# Patient Record
Sex: Male | Born: 1980 | Hispanic: Yes | Marital: Single | Smoking: Never smoker
Health system: Southern US, Community
[De-identification: ages and names within clinical notes are randomized; demographics above are authoritative.]

## PROBLEM LIST (undated history)

## (undated) DIAGNOSIS — Z789 Other specified health status: Secondary | ICD-10-CM

## (undated) DIAGNOSIS — M109 Gout, unspecified: Secondary | ICD-10-CM

## (undated) HISTORY — DX: Other specified health status: Z78.9

## (undated) HISTORY — PX: NO PAST SURGERIES: SHX2092

---

## 2016-06-09 ENCOUNTER — Emergency Department (HOSPITAL_COMMUNITY)
Admission: EM | Admit: 2016-06-09 | Discharge: 2016-06-10 | Disposition: A | Discharge status: Home / Self Care | Payer: Self-pay | Attending: Emergency Medicine | Admitting: Emergency Medicine

## 2016-06-09 ENCOUNTER — Emergency Department (HOSPITAL_COMMUNITY): Payer: Self-pay

## 2016-06-09 ENCOUNTER — Encounter (HOSPITAL_COMMUNITY): Payer: Self-pay | Admitting: Emergency Medicine

## 2016-06-09 DIAGNOSIS — W1839XA Other fall on same level, initial encounter: Secondary | ICD-10-CM | POA: Insufficient documentation

## 2016-06-09 DIAGNOSIS — Y929 Unspecified place or not applicable: Secondary | ICD-10-CM | POA: Insufficient documentation

## 2016-06-09 DIAGNOSIS — Y939 Activity, unspecified: Secondary | ICD-10-CM | POA: Insufficient documentation

## 2016-06-09 DIAGNOSIS — S2242XA Multiple fractures of ribs, left side, initial encounter for closed fracture: Secondary | ICD-10-CM | POA: Insufficient documentation

## 2016-06-09 DIAGNOSIS — Y999 Unspecified external cause status: Secondary | ICD-10-CM | POA: Insufficient documentation

## 2016-06-09 LAB — CBC WITH DIFFERENTIAL/PLATELET
Basophils Absolute: 0.1 10*3/uL (ref 0.0–0.1)
Basophils Relative: 1 %
Eosinophils Absolute: 0.4 10*3/uL (ref 0.0–0.7)
Eosinophils Relative: 5 %
HCT: 44.4 % (ref 39.0–52.0)
Hemoglobin: 15.6 g/dL (ref 13.0–17.0)
Lymphocytes Relative: 18 %
Lymphs Abs: 1.4 10*3/uL (ref 0.7–4.0)
MCH: 31.2 pg (ref 26.0–34.0)
MCHC: 35.1 g/dL (ref 30.0–36.0)
MCV: 88.8 fL (ref 78.0–100.0)
Monocytes Absolute: 0.5 10*3/uL (ref 0.1–1.0)
Monocytes Relative: 7 %
Neutro Abs: 5.4 10*3/uL (ref 1.7–7.7)
Neutrophils Relative %: 69 %
Platelets: 256 10*3/uL (ref 150–400)
RBC: 5 MIL/uL (ref 4.22–5.81)
RDW: 11.9 % (ref 11.5–15.5)
WBC: 7.7 10*3/uL (ref 4.0–10.5)

## 2016-06-09 LAB — URINALYSIS, ROUTINE W REFLEX MICROSCOPIC
Bilirubin Urine: NEGATIVE
Glucose, UA: NEGATIVE mg/dL
Hgb urine dipstick: NEGATIVE
Ketones, ur: NEGATIVE mg/dL
Leukocytes, UA: NEGATIVE
Nitrite: NEGATIVE
Protein, ur: NEGATIVE mg/dL
Specific Gravity, Urine: 1.004 — ABNORMAL LOW (ref 1.005–1.030)
pH: 5.5 (ref 5.0–8.0)

## 2016-06-09 LAB — BASIC METABOLIC PANEL
Anion gap: 10 (ref 5–15)
BUN: 5 mg/dL — ABNORMAL LOW (ref 6–20)
CO2: 23 mmol/L (ref 22–32)
Calcium: 8.8 mg/dL — ABNORMAL LOW (ref 8.9–10.3)
Chloride: 100 mmol/L — ABNORMAL LOW (ref 101–111)
Creatinine, Ser: 0.57 mg/dL — ABNORMAL LOW (ref 0.61–1.24)
GFR calc Af Amer: >60 mL/min (ref >60)
GFR calc non Af Amer: >60 mL/min (ref >60)
Glucose, Bld: 120 mg/dL — ABNORMAL HIGH (ref 65–99)
Potassium: 3.6 mmol/L (ref 3.5–5.1)
Sodium: 133 mmol/L — ABNORMAL LOW (ref 135–145)

## 2016-06-09 MED ORDER — CYCLOBENZAPRINE HCL 10 MG PO TABS: 10.0000 mg | ORAL_TABLET | Freq: Two times a day (BID) | ORAL | 0 refills | Status: DC | PRN

## 2016-06-09 MED ORDER — DEXAMETHASONE SODIUM PHOSPHATE 10 MG/ML IJ SOLN
10.0000 mg | Freq: Once | INTRAMUSCULAR | Status: AC
  Administered 2016-06-10: 10 mg via INTRAVENOUS
  Filled 2016-06-09: qty 1

## 2016-06-09 MED ORDER — SODIUM CHLORIDE 0.9 % IV BOLUS (SEPSIS)
500.0000 mL | Freq: Once | INTRAVENOUS | Status: AC
  Administered 2016-06-09: 500 mL via INTRAVENOUS

## 2016-06-09 MED ORDER — HYDROCODONE-ACETAMINOPHEN 5-325 MG PO TABS: 1.0000 | ORAL_TABLET | Freq: Three times a day (TID) | ORAL | 0 refills | Status: DC | PRN

## 2016-06-09 MED ORDER — DIAZEPAM 5 MG/ML IJ SOLN
2.5000 mg | Freq: Once | INTRAMUSCULAR | Status: AC
  Administered 2016-06-09: 2.5 mg via INTRAVENOUS
  Filled 2016-06-09: qty 2

## 2016-06-09 MED ORDER — FENTANYL CITRATE (PF) 100 MCG/2ML IJ SOLN
100.0000 ug | Freq: Once | INTRAMUSCULAR | Status: AC
  Administered 2016-06-09: 100 ug via INTRAVENOUS
  Filled 2016-06-09: qty 2

## 2016-06-09 MED ORDER — IBUPROFEN 800 MG PO TABS: 800.0000 mg | ORAL_TABLET | Freq: Three times a day (TID) | ORAL | 0 refills | Status: DC

## 2016-06-09 MED ORDER — KETOROLAC TROMETHAMINE 30 MG/ML IJ SOLN
30.0000 mg | Freq: Once | INTRAMUSCULAR | Status: AC
  Administered 2016-06-10: 30 mg via INTRAVENOUS
  Filled 2016-06-09: qty 1

## 2016-06-09 NOTE — ED Provider Notes (Signed)
[facility]-EMERGENCY DEPT Provider Note   CSN: [id number] Arrival date & time: 06/09/16  2039     History   Chief Complaint Chief Complaint  Patient presents with  . Back Pain    HPI Evan Green is a 35 y.o. male.  The history is provided by the patient and a relative. The history is limited by a language barrier. A language interpreter was used.  Back Pain   This is a new problem. The current episode started 3 to 5 hours ago. The problem occurs constantly. The problem has not changed since onset.The pain is associated with falling. The pain is present in the thoracic spine. Associated symptoms include tingling. Pertinent negatives include no chest pain, no fever, no numbness, no abdominal pain, no abdominal swelling, no bowel incontinence, no perianal numbness, no bladder incontinence, no dysuria, no pelvic pain, no leg pain and no weakness. He has tried nothing for the symptoms.     Pt is 35 y/o male, otherwise healthy, presents to the ER with sudden onset left back, rib and side pain that began 3 hours ago secondary to mechanical fall backwards from standing position landed with his left back and side on the seat of a chair.  His pain is severe, 10/10, constant, pt is unable to describe anyway other than "very painful" and "deep inside back", worse with movement, palpation and breathing, no alleviating factors.  The pain radiates down his left leg and is worse with leg movement, has some associated leg tingling, but denies numbness or weakness..  No treatments attempted prior to arrival.  He denies CP, abdominal pain, hematuria, Cough, hemoptysis, shortness of breath. He denies head injury, LOC, or any other associated sx.      History reviewed. No pertinent past medical history.  There are no active problems to display for this patient.   History reviewed. No pertinent surgical history.     Home Medications    Prior to Admission medications   Medication Sig Start Date  End Date Taking? Authorizing Provider  cyclobenzaprine (FLEXERIL) 10 MG tablet Take 1 tablet (10 mg total) by mouth 2 (two) times daily as needed for muscle spasms. 06/09/16   Danelle Berry, PA-C  HYDROcodone-acetaminophen (NORCO/VICODIN) 5-325 MG tablet Take 1-2 tablets by mouth every 8 (eight) hours as needed. 06/09/16   Danelle Berry, PA-C  ibuprofen (ADVIL,MOTRIN) 800 MG tablet Take 1 tablet (800 mg total) by mouth 3 (three) times daily. 06/09/16   Danelle Berry, PA-C    Family History No family history on file.  Social History Social History  Substance Use Topics  . Smoking status: Never Smoker  . Smokeless tobacco: Never Used  . Alcohol use Yes     Allergies   Review of patient's allergies indicates no known allergies.   Review of Systems Review of Systems  Constitutional: Negative for fever.  Cardiovascular: Negative for chest pain.  Gastrointestinal: Negative for abdominal pain and bowel incontinence.  Genitourinary: Negative for bladder incontinence, dysuria and pelvic pain.  Musculoskeletal: Positive for back pain.  Neurological: Positive for tingling. Negative for weakness and numbness.  All other systems reviewed and are negative.    Physical Exam Updated Vital Signs BP 104/76 (BP Location: Left Arm)   Pulse 73   Temp 97.4 F (36.3 C) (Oral)   Resp 17   Ht 5\' 4"  (1.626 m)   Wt 69.1 kg   SpO2 99%   BMI 26.14 kg/m   Physical Exam  Constitutional: He is oriented to person, place,  and time. He appears well-developed and well-nourished. No distress.  HENT:  Head: Normocephalic and atraumatic.  Right Ear: External ear normal.  Left Ear: External ear normal.  Nose: Nose normal.  Mouth/Throat: Oropharynx is clear and moist. No oropharyngeal exudate.  Eyes: Conjunctivae and EOM are normal. Pupils are equal, round, and reactive to light. Right eye exhibits no discharge. Left eye exhibits no discharge. No scleral icterus.  Neck: Normal range of motion. Neck supple.    Cardiovascular: Normal rate, regular rhythm, normal heart sounds and intact distal pulses.  Exam reveals no gallop and no friction rub.   No murmur heard.   Pulmonary/Chest: Effort normal and breath sounds normal. No stridor. No respiratory distress. He has no wheezes. He has no rales.     He exhibits tenderness.  Left lateral ribs ttp  Abdominal: Soft. Normal appearance and bowel sounds are normal. He exhibits no distension and no mass. There is no tenderness. There is CVA tenderness. There is no rigidity, no rebound, no guarding and negative Murphy's sign.    Musculoskeletal: Normal range of motion. He exhibits tenderness. He exhibits no edema or deformity.       Cervical back: Normal.       Thoracic back: He exhibits tenderness. He exhibits normal range of motion, no bony tenderness, no swelling, no edema, no deformity, no laceration, no pain and no spasm.       Lumbar back: He exhibits tenderness. He exhibits normal range of motion, no bony tenderness, no swelling, no edema, no deformity, no laceration, no pain, no spasm and normal pulse.       Back:  Neurological: He is alert and oriented to person, place, and time. He exhibits normal muscle tone. Coordination normal.  Normal sensation to light touch in all extremities 5/5 dorsiflexion and plantarflexion bilaterally Antalgic gait  Skin: Skin is warm and dry. No abrasion, no bruising and no rash noted. He is not diaphoretic. No erythema. No pallor.  No erythema, abrasions, edema, bruising to chest, back or abdomen  Psychiatric: He has a normal mood and affect. His behavior is normal. Judgment and thought content normal.  Nursing note and vitals reviewed.    ED Treatments / Results  Labs (all labs ordered are listed, but only abnormal results are displayed) Labs Reviewed  URINALYSIS, ROUTINE W REFLEX MICROSCOPIC (NOT AT [facility]) - Abnormal; Notable for the following:       Result Value   Specific Gravity, Urine 1.004 (*)    All  other components within normal limits  BASIC METABOLIC PANEL - Abnormal; Notable for the following:    Sodium 133 (*)    Chloride 100 (*)    Glucose, Bld 120 (*)    BUN 5 (*)    Creatinine, Ser 0.57 (*)    Calcium 8.8 (*)    All other components within normal limits  CBC WITH DIFFERENTIAL/PLATELET    EKG  EKG Interpretation None       Radiology Dg Ribs Unilateral W/chest Left  Result Date: 06/09/2016 CLINICAL DATA:  Status post fall.  Left chest wall pain. EXAM: LEFT RIBS AND CHEST - 3+ VIEW COMPARISON:  None. FINDINGS: There is a nondisplaced fracture of the midshaft of the left eleventh rib. Possible nondisplaced fracture of the distal aspect of the left Health for a is also noted. Cardiomediastinal contours are normal. Lungs are clear. IMPRESSION: Suspected nondisplaced fractures of the mid to distal left eleventh and twelfth ribs. Electronically Signed   By: Deatra Robinson  M.D.   On: 06/09/2016 22:49    Procedures Procedures (including critical care time)  Medications Ordered in ED Medications  diazepam (VALIUM) injection 2.5 mg (2.5 mg Intravenous Given 06/09/16 2207)  fentaNYL (SUBLIMAZE) injection 100 mcg (100 mcg Intravenous Given 06/09/16 2206)  sodium chloride 0.9 % bolus 500 mL (0 mLs Intravenous Stopped 06/10/16 0003)  dexamethasone (DECADRON) injection 10 mg (10 mg Intravenous Given 06/10/16 0000)  ketorolac (TORADOL) 30 MG/ML injection 30 mg (30 mg Intravenous Given 06/10/16 0000)     Initial Impression / Assessment and Plan / ED Course  I have reviewed the triage vital signs and the nursing notes.  Pertinent labs & imaging results that were available during my care of the patient were reviewed by me and considered in my medical decision making (see chart for details).  Clinical Course  Patient with mechanical fall into a chair, striking his left side and back. He has localized tenderness to palpation in his left lower lateral ribs. Normal breath sounds.   Basic lab work obtained at triage, prior to my evaluation. Left rib films pertinent for suspected nondisplaced fractures of the mid to distal left 11th and 12th ribs. This doesn't clinically correlate with his tenderness. He has no respiratory distress, no flail chest, is maintaining normal oxygenation, and has no work of breathing. We'll give pain medication, muscle relaxers, and incentive spirometry.  Reviewed with patient the purpose and procedure for using incentive spirometry and was encouraged to use it every hour.  He was given a work note to give him light duty for much of the next week. He was encouraged to return to the ER if he is having shortness of breath, productive sputum, fever. He does not have a PCP, and return precautions were reviewed with him at length, attempted to prevent discharge instructions in Spanish however it did not work Engineer, structuralpanish translator by phone and Spanish-speaking staff that was used to review instructions, all questions asked and answered, patient verbalizes understanding of plan. He is discharged home in good condition with stable vital signs.  Final Clinical Impressions(s) / ED Diagnoses   Final diagnoses:  Closed fracture of multiple ribs of left side, initial encounter    New Prescriptions Discharge Medication List as of 06/09/2016 11:54 PM    START taking these medications   Details  cyclobenzaprine (FLEXERIL) 10 MG tablet Take 1 tablet (10 mg total) by mouth 2 (two) times daily as needed for muscle spasms., Starting Thu 06/09/2016, Print    HYDROcodone-acetaminophen (NORCO/VICODIN) 5-325 MG tablet Take 1-2 tablets by mouth every 8 (eight) hours as needed., Starting Thu 06/09/2016, Print    ibuprofen (ADVIL,MOTRIN) 800 MG tablet Take 1 tablet (800 mg total) by mouth 3 (three) times daily., Starting Thu 06/09/2016, Print         Danelle BerryLeisa Virl Coble, PA-C 06/10/16 [id number]    Raeford RazorStephen Kohut, MD 06/12/16 [phone]

## 2016-06-09 NOTE — ED Notes (Signed)
Patient transported to X-ray 

## 2016-06-09 NOTE — ED Notes (Signed)
Urine specimen sent with pt to room.

## 2016-06-09 NOTE — ED Triage Notes (Signed)
Pt. reports left lower back/flank pain onset this evening , denies injury , no hematuria or dysuria , pain increases with movement / changing positions .

## 2016-06-09 NOTE — ED Notes (Signed)
Pt. returned from XR. 

## 2017-06-25 ENCOUNTER — Emergency Department (HOSPITAL_COMMUNITY)
Admission: EM | Admit: 2017-06-25 | Discharge: 2017-06-25 | Disposition: A | Discharge status: Home / Self Care | Payer: Self-pay | Attending: Emergency Medicine | Admitting: Emergency Medicine

## 2017-06-25 ENCOUNTER — Encounter (HOSPITAL_COMMUNITY): Payer: Self-pay

## 2017-06-25 ENCOUNTER — Other Ambulatory Visit: Payer: Self-pay

## 2017-06-25 DIAGNOSIS — M25561 Pain in right knee: Secondary | ICD-10-CM | POA: Insufficient documentation

## 2017-06-25 DIAGNOSIS — I776 Arteritis, unspecified: Secondary | ICD-10-CM | POA: Insufficient documentation

## 2017-06-25 DIAGNOSIS — D692 Other nonthrombocytopenic purpura: Secondary | ICD-10-CM | POA: Insufficient documentation

## 2017-06-25 DIAGNOSIS — M25562 Pain in left knee: Secondary | ICD-10-CM | POA: Insufficient documentation

## 2017-06-25 LAB — URINALYSIS, ROUTINE W REFLEX MICROSCOPIC
BILIRUBIN URINE: NEGATIVE
Glucose, UA: NEGATIVE mg/dL
KETONES UR: NEGATIVE mg/dL
LEUKOCYTES UA: NEGATIVE
Nitrite: NEGATIVE
PH: 5 (ref 5.0–8.0)
PROTEIN: NEGATIVE mg/dL
SQUAMOUS EPITHELIAL / LPF: NONE SEEN
Specific Gravity, Urine: 1.004 — ABNORMAL LOW (ref 1.005–1.030)

## 2017-06-25 LAB — COMPREHENSIVE METABOLIC PANEL
ALBUMIN: 4.1 g/dL (ref 3.5–5.0)
ALT: 115 U/L — AB (ref 17–63)
AST: 135 U/L — ABNORMAL HIGH (ref 15–41)
Alkaline Phosphatase: 125 U/L (ref 38–126)
Anion gap: 12 (ref 5–15)
BUN: 11 mg/dL (ref 6–20)
CHLORIDE: 101 mmol/L (ref 101–111)
CO2: 27 mmol/L (ref 22–32)
Calcium: 9.5 mg/dL (ref 8.9–10.3)
Creatinine, Ser: 0.73 mg/dL (ref 0.61–1.24)
GFR calc non Af Amer: >60 mL/min (ref >60)
Glucose, Bld: 91 mg/dL (ref 65–99)
Potassium: 3.9 mmol/L (ref 3.5–5.1)
SODIUM: 140 mmol/L (ref 135–145)
TOTAL PROTEIN: 8.1 g/dL (ref 6.5–8.1)
Total Bilirubin: 0.8 mg/dL (ref 0.3–1.2)

## 2017-06-25 LAB — CBC WITH DIFFERENTIAL/PLATELET
BASOS ABS: 0 10*3/uL (ref 0.0–0.1)
Basophils Relative: 0 %
Eosinophils Absolute: 0.1 10*3/uL (ref 0.0–0.7)
Eosinophils Relative: 1 %
HEMATOCRIT: 47.2 % (ref 39.0–52.0)
HEMOGLOBIN: 16.7 g/dL (ref 13.0–17.0)
Lymphocytes Relative: 12 %
Lymphs Abs: 1.4 10*3/uL (ref 0.7–4.0)
MCH: 31.7 pg (ref 26.0–34.0)
MCHC: 35.4 g/dL (ref 30.0–36.0)
MCV: 89.6 fL (ref 78.0–100.0)
Monocytes Absolute: 0.8 10*3/uL (ref 0.1–1.0)
Monocytes Relative: 7 %
NEUTROS ABS: 8.9 10*3/uL — AB (ref 1.7–7.7)
NEUTROS PCT: 80 %
PLATELETS: 317 10*3/uL (ref 150–400)
RBC: 5.27 MIL/uL (ref 4.22–5.81)
RDW: 12.2 % (ref 11.5–15.5)
WBC: 11.2 10*3/uL — AB (ref 4.0–10.5)

## 2017-06-25 LAB — SEDIMENTATION RATE: SED RATE: 5 mm/h (ref 0–16)

## 2017-06-25 LAB — MONONUCLEOSIS SCREEN: Mono Screen: NEGATIVE

## 2017-06-25 NOTE — Discharge Instructions (Signed)
IT IS VERY IMPORTANT FOR YOU TO CALL THE CLINIC TO GET AN APPOINTMENT THIS WEEK. COME BACK TO THE ER IF YOU HAVE A FEVER, ABDOMINAL PAIN, BLOOD IN YOUR URINE, OR IF THE RASH GETS A LOT WORSE.

## 2017-06-25 NOTE — ED Provider Notes (Signed)
[place] DEPT Provider Note   CSN: [id number] Arrival date & time: 06/25/17  1747     History   Chief Complaint Chief Complaint  Patient presents with  . Knee Pain  . Rash    HPI Evan Green is a 36 y.o. male.  36 year old male who presents with rash and knee pain.  Today he began having bilateral knee pain that is worse with walking as well as some pain in his dorsal right foot.  He noticed a few red spots on his abdomen yesterday but looked down today and noticed that the rash had spread to his bilateral thighs.  He denies any significant itching, but he has mild pain associated with it.  No abdominal pain, vomiting, diarrhea, fevers, contacts with similar rash, or recent travel.  No new medications or supplements.  He is not sexually active.  No chest pain or breathing problems.  No urinary symptoms. He drinks ~6 beers/day.   The history is provided by the patient.    History reviewed. No pertinent past medical history.  There are no active problems to display for this patient.   History reviewed. No pertinent surgical history.     Home Medications    Prior to Admission medications   Medication Sig Start Date End Date Taking? Authorizing Provider  cyclobenzaprine (FLEXERIL) 10 MG tablet Take 1 tablet (10 mg total) by mouth 2 (two) times daily as needed for muscle spasms. 06/09/16   Delsa Grana, PA-C  HYDROcodone-acetaminophen (NORCO/VICODIN) 5-325 MG tablet Take 1-2 tablets by mouth every 8 (eight) hours as needed. 06/09/16   Delsa Grana, PA-C  ibuprofen (ADVIL,MOTRIN) 800 MG tablet Take 1 tablet (800 mg total) by mouth 3 (three) times daily. 06/09/16   Delsa Grana, PA-C    Family History No family history on file.  Social History Social History   Tobacco Use  . Smoking status: Never Smoker  . Smokeless tobacco: Never Used  Substance Use Topics  . Alcohol use: Yes  . Drug use: No     Allergies   Patient has  no known allergies.   Review of Systems Review of Systems All other systems reviewed and are negative except that which was mentioned in HPI   Physical Exam Updated Vital Signs BP (!) 135/106 (BP Location: Left Arm)   Pulse 86   Temp 98.7 F (37.1 C) (Oral)   Resp 16   Ht _0  (1.626 m)   Wt 67.1 kg (148 lb)   SpO2 99%   BMI 25.40 kg/m   Physical Exam  Constitutional: He is oriented to person, place, and time. He appears well-developed and well-nourished. No distress.  HENT:  Head: Normocephalic and atraumatic.  Moist mucous membranes  Eyes: Conjunctivae are normal. Pupils are equal, round, and reactive to light.  Neck: Neck supple.  Cardiovascular: Normal rate, regular rhythm and normal heart sounds.  No murmur heard. Pulmonary/Chest: Effort normal and breath sounds normal.  Abdominal: Soft. Bowel sounds are normal. He exhibits no distension. There is no tenderness.  Musculoskeletal: He exhibits tenderness.  Mild b/l knee tenderness diffusely without obvious joint effusion; mild swelling of dorsal R foot without associated redness  Neurological: He is alert and oriented to person, place, and time.  Fluent speech  Skin: Skin is warm and dry. Rash noted.  See photo  Psychiatric: He has a normal mood and affect. Judgment normal.  Nursing note and vitals reviewed.        ED Treatments / Results  Labs (all labs ordered are listed, but only abnormal results are displayed) Labs Reviewed  COMPREHENSIVE METABOLIC PANEL - Abnormal; Notable for the following components:      Result Value   AST 135 (*)    ALT 115 (*)    All other components within normal limits  CBC WITH DIFFERENTIAL/PLATELET - Abnormal; Notable for the following components:   WBC 11.2 (*)    Neutro Abs 8.9 (*)    All other components within normal limits  URINALYSIS, ROUTINE W REFLEX MICROSCOPIC - Abnormal; Notable for the following components:   Color, Urine STRAW (*)    Specific Gravity, Urine  1.004 (*)    Hgb urine dipstick SMALL (*)    Bacteria, UA RARE (*)    All other components within normal limits  MONONUCLEOSIS SCREEN  SEDIMENTATION RATE  C-REACTIVE PROTEIN    EKG  EKG Interpretation None       Radiology No results found.  Procedures Procedures (including critical care time)  Medications Ordered in ED Medications - No data to display   Initial Impression / Assessment and Plan / ED Course  I have reviewed the triage vital signs and the nursing notes.  Pertinent labs that were available during my care of the patient were reviewed by me and considered in my medical decision making (see chart for details).     PT w/ 1 day of b/l knee pain now with worsening rash on legs. VS reassuring, afebrile. Purpura on exam. Appearance suggests vasculitis, HSP would be very unusual for his age. Concurrent joint pains further suggest autoimmune process. He denies sexual activity, although I considered disseminated gonococcal infection I feel this is extremely unlikely.  His labs show normal hemoglobin and platelets, normal creatinine thus I doubt TTP or ITP. UA also reassuring. WBC 11.2 and patient has denied any other infectious symptoms, although viral infection is possible I feel this is less likely.  His LFTs are mildly elevated at AST 135, ALT 115.  Can see this elevation with an acute viral infection but he also admits to moderate amount of drinking daily.  I have instructed him to discontinue all alcohol use to see if his LFTs improve spontaneously.  Mononucleosis negative and ESR normal.  I have explained that it will be very important for him to follow closely in a clinic to have further testing, including possible referral to rheumatology or dermatology for biopsy.  I have given him internal medicine teaching clinic contact information and I have also left a voicemail with case manager to follow-up with him.  His correct phone number has been updated in chart.  Extensively  reviewed return precautions with him including fever, worsening rash, hematuria, abdominal pain, or any other sudden change in symptoms.  He voiced understanding and was discharged in satisfactory condition.  Final Clinical Impressions(s) / ED Diagnoses   Final diagnoses:  Purpura ([place])  Arthralgia of both knees  Vasculitis [facility])    ED Discharge Orders    None       Levada Bowersox, Wenda Overland, MD 06/25/17 2354

## 2017-06-25 NOTE — ED Triage Notes (Signed)
Pt  Is alert and oriented x 4 and is friendly. presents with c/o bilateral knee pain with joint swelling that worsens with ambulation . Pt has palpable red rash on bilateral upper legs pt denies itching, pt reports symptoms started today.

## 2017-06-26 LAB — C-REACTIVE PROTEIN

## 2017-07-03 ENCOUNTER — Ambulatory Visit: Payer: Self-pay | Admitting: Internal Medicine

## 2017-07-03 VITALS — BP 123/80 | HR 56 | Temp 97.8°F | Wt 157.8 lb

## 2017-07-03 DIAGNOSIS — Z7251 High risk heterosexual behavior: Secondary | ICD-10-CM

## 2017-07-03 DIAGNOSIS — D692 Other nonthrombocytopenic purpura: Secondary | ICD-10-CM

## 2017-07-03 NOTE — Patient Instructions (Signed)
Mr. Evan Green,  I am going to check some lab work on you today. I will let you know the results in the next couple of days. If the labs are normal I do not think we need to pursue any more testing as your symptoms are improving. Please follow up with us if the rash or symptoms return and we can look more into things. Otherwise, please come back and see us in 1 year.

## 2017-07-03 NOTE — Assessment & Plan Note (Signed)
Patient with resolving palpable purpura over the medial aspects of his bilateral thighs with associated bilateral knee pain. Labs done in the ED were unremarkable. ESR and CRP both normal. Would expect some degree of elevation with a vasculitic process. Symptoms appear to be improving and resolving on their own over the past week.   Will check for HIV, Hep C and B. Given his high risk sexual history will also check for G/Ch and syphilis as well.   If labs return normal do not believe further work up is necessary at this time unless symptoms reoccur.

## 2017-07-03 NOTE — Progress Notes (Signed)
CC: Rash/Arthralgias; to establish care  HPI:  [name] is a 36 y.o. male with no past medical history presents to clinic today following 06/25/17 ED visit at [facility] for rash and knee pain to establish care in our clinic.   He initially presented to the ED last week with complaints of bilateral knee pain and rash on his abdomen and bilateral thighs. Found to have purpura on exam. Labs were only remarkable for mildly elevated AST/ALT (135/115) and mild leukocytosis (11.2) but otherwise CMET, CBC, ESR, CRP were all unremarkable. UA was obtained with small blood seen but no RBCs.   Today, he reports that his bilateral knees continue to bother him. Symptoms began 8 days prior suddenly while at work. He denies any falls or trauma or other precipitating event. Reports a sharp pain in both of his knees. The pain is precipitated by rising from a seated position or with exertion. She has been taking naproxen with does improve his symptoms. He reports he has had similar pain in his right knee 1 year prior which improved following knee injection and taking 'some pills' but he is unsure what medication he was prescribed. Does report associated swelling and warmth in both his knees though neither is present today. States that the rash appeared suddenly at the same time as the knee pain and has never been present before. This was painful at the time. Reports that the rash has almost completely resolved. He denies being sick prior to the episode. No know family history of any vasculitis, autoimmune or other diseases. He denies any fevers, chills, night sweats, fatigue, myalgias, dark urine or hematuria, abdominal pain or melena, chest pain, dyspnea, peripheral neuropathy. Does not 2 week history of non-productive cough.   Reports history of 6 sexual partners in the past year with intermittent condom use. Denies any dysuria, penile lesions or discharge.   PMH None  PSH No prior  surgeries  FH Patient is not aware of any family medical hisotry  Social History   Socioeconomic History  . Marital status: Single    Spouse name: None  . Number of children: None  . Years of education: None  . Highest education level: None  Social Needs  . Financial resource strain: None  . Food insecurity - worry: None  . Food insecurity - inability: None  . Transportation needs - medical: None  . Transportation needs - non-medical: None  Occupational History  . Occupation: Cook  Tobacco Use  . Smoking status: Never Smoker  . Smokeless tobacco: Never Used  Substance and Sexual Activity  . Alcohol use: Yes    Comment: 10 beers a day; quit 06/25/17  . Drug use: No  . Sexual activity: Yes    Partners: Female    Comment: 6 different partners in the past year. Uses condoms occasionally.   Other Topics Concern  . None  Social History Narrative   Lives alone. In [place].    Review of Systems:  Negative except as noted in HPI  Physical Exam:  Vitals:   07/03/17 0939  BP: 123/80  Pulse: (!) 56  Temp: 97.8 F (36.6 C)  TempSrc: Oral  SpO2: 100%  Weight: 157 lb 12.8 oz (71.6 kg)   Physical Exam  Constitutional: He is oriented to person, place, and time and well-developed, well-nourished, and in no distress. No distress.  HENT:  Head: Normocephalic and atraumatic.  Right Ear: External ear normal.  Left Ear: External ear normal.  Nose: Nose normal.  Mouth/Throat: Oropharynx is clear and moist. No oropharyngeal exudate.  Eyes: EOM are normal. Pupils are equal, round, and reactive to light.  Neck: Normal range of motion.  Cardiovascular: Normal rate, regular rhythm and normal heart sounds.  Pulmonary/Chest: Effort normal and breath sounds normal. He has no wheezes. He has no rales.  Abdominal: Soft. Bowel sounds are normal. He exhibits no distension and no mass. There is no tenderness.  Musculoskeletal: He exhibits no edema.       Right knee: Normal.       Left  knee: Normal.  Lymphadenopathy:    He has no cervical adenopathy.  Neurological: He is alert and oriented to person, place, and time.  Skin: Skin is warm and dry.  Circular areas of mild hyperpigmentation over bilateral inner thighs. No erythema or warmth.  Psychiatric: Mood and affect normal.    Assessment & Plan:   See Encounters Tab for problem based charting.  Patient discussed with Evan Green

## 2017-07-03 NOTE — Progress Notes (Signed)
Case discussed with Dr. Karma GreaserBoswell at the time of the visit. We reviewed the resident's history and exam and pertinent patient test results. I agree with the assessment, diagnosis, and plan of care documented in the resident's note.  The cause of the rash is unclear, but given the risky behavior and transaminitis I agree with the initial work-up Dr. Karma GreaserBoswell has proposed.  With the risky behavior and arthralgias we will keep Behcet's in the back of our minds, especially if there is recurrence or oral ulcers.

## 2017-07-03 NOTE — Assessment & Plan Note (Signed)
6 male partners in the past year with intermittent condom use.   Checking HIV, RPR, G/Ch today.

## 2017-07-04 LAB — HEPATITIS B CORE ANTIBODY, TOTAL: Hep B Core Total Ab: NEGATIVE

## 2017-07-04 LAB — HEPATITIS B SURFACE ANTIBODY,QUALITATIVE: Hep B Surface Ab, Qual: NONREACTIVE

## 2017-07-04 LAB — RPR: RPR: NONREACTIVE

## 2017-07-04 LAB — URINE CYTOLOGY ANCILLARY ONLY
Chlamydia: NEGATIVE
NEISSERIA GONORRHEA: NEGATIVE

## 2017-07-04 LAB — HEPATITIS C ANTIBODY

## 2017-07-04 LAB — HEPATITIS B SURFACE ANTIGEN: HEP B S AG: NEGATIVE

## 2017-07-04 LAB — HIV ANTIBODY (ROUTINE TESTING W REFLEX): HIV SCREEN 4TH GENERATION: NONREACTIVE

## 2017-11-09 ENCOUNTER — Other Ambulatory Visit: Payer: Self-pay

## 2017-11-09 ENCOUNTER — Emergency Department (HOSPITAL_COMMUNITY): Payer: Self-pay

## 2017-11-09 ENCOUNTER — Encounter (HOSPITAL_COMMUNITY): Payer: Self-pay | Admitting: Emergency Medicine

## 2017-11-09 ENCOUNTER — Emergency Department (HOSPITAL_COMMUNITY)
Admission: EM | Admit: 2017-11-09 | Discharge: 2017-11-09 | Disposition: A | Discharge status: Home / Self Care | Payer: Self-pay | Attending: Emergency Medicine | Admitting: Emergency Medicine

## 2017-11-09 DIAGNOSIS — M654 Radial styloid tenosynovitis [de Quervain]: Secondary | ICD-10-CM | POA: Insufficient documentation

## 2017-11-09 DIAGNOSIS — Z79899 Other long term (current) drug therapy: Secondary | ICD-10-CM | POA: Insufficient documentation

## 2017-11-09 MED ORDER — NAPROXEN 250 MG PO TABS: 500.0000 mg | ORAL_TABLET | Freq: Once | ORAL | Status: DC

## 2017-11-09 MED ORDER — IBUPROFEN 400 MG PO TABS
400.0000 mg | ORAL_TABLET | Freq: Once | ORAL | Status: AC | PRN
  Administered 2017-11-09: 400 mg via ORAL
  Filled 2017-11-09: qty 1

## 2017-11-09 MED ORDER — NAPROXEN 500 MG PO TABS: 500.0000 mg | ORAL_TABLET | Freq: Two times a day (BID) | ORAL | 0 refills | Status: DC

## 2017-11-09 MED ORDER — IBUPROFEN 400 MG PO TABS
400.0000 mg | ORAL_TABLET | Freq: Once | ORAL | Status: AC
  Administered 2017-11-09: 400 mg via ORAL
  Filled 2017-11-09: qty 1

## 2017-11-09 NOTE — ED Triage Notes (Signed)
Reports waking up Wednesday with swelling and pain in left hand/wrist.  Has not taken anything for pain.  Noted to be red and slightly swollen.  Denies any injury.

## 2017-11-09 NOTE — Discharge Instructions (Signed)
Please read and follow all provided instructions.  Your diagnoses today include:  1. De Quervain's disease (tenosynovitis)    Tests performed today include:  An x-ray of the affected area - does NOT show any broken bones  Vital signs. See below for your results today.   Medications prescribed:   Naproxen - anti-inflammatory pain medication  Do not exceed 500mg  naproxen every 12 hours, take with food  You have been prescribed an anti-inflammatory medication or NSAID. Take with food. Take smallest effective dose for the shortest duration needed for your pain. Stop taking if you experience stomach pain or vomiting.   Take any prescribed medications only as directed.  Home care instructions:   Follow any educational materials contained in this packet  Follow R.I.C.E. Protocol:  R - rest your injury   I  - use ice on injury without applying directly to skin  C - compress injury with bandage or splint  E - elevate the injury as much as possible  Follow-up instructions: Please follow-up with your primary care provider if you continue to have significant pain in 1 week.  Return instructions:   Please return if your fingers are numb or tingling, appear gray or blue, or you have severe pain (also elevate the arm and loosen splint or wrap if you were given one)  Please return to the Emergency Department if you experience worsening symptoms.   Please return if you have any other emergent concerns.  Additional Information:  Your vital signs today were: BP 114/71 (BP Location: Right Arm)    Pulse 79    Temp 98.9 F (37.2 C) (Oral)    Resp 16    SpO2 100%  If your blood pressure (BP) was elevated above 135/85 this visit, please have this repeated by your doctor within one month. --------------

## 2017-11-09 NOTE — ED Provider Notes (Signed)
[facility] Provider Note   CSN: [id number] Arrival date & time: 11/09/17  0025     History   Chief Complaint Chief Complaint  Patient presents with  . Hand Pain    HPI [facility] Didion is a 37 y.o. male.  Patient reports waking up this morning with acute pain in his left radial wrist.  He denies acute injury.  Pain is worse with movement and palpation.  No treatments prior to arrival.  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  No weakness, numbness, or tingling in the fingers.  No elbow or shoulder pain.  No neck pain.     Past Medical History:  Diagnosis Date  . Known health problems: none     Patient Active Problem List   Diagnosis Date Noted  . Palpable purpura (HCC) 07/03/2017  . High risk sexual behavior 07/03/2017    Past Surgical History:  Procedure Laterality Date  . NO PAST SURGERIES          Home Medications    Prior to Admission medications   Medication Sig Start Date End Date Taking? Authorizing Provider  cyclobenzaprine (FLEXERIL) 10 MG tablet Take 1 tablet (10 mg total) by mouth 2 (two) times daily as needed for muscle spasms. 06/09/16   Danelle Berryapia, Leisa, PA-C  HYDROcodone-acetaminophen (NORCO/VICODIN) 5-325 MG tablet Take 1-2 tablets by mouth every 8 (eight) hours as needed. 06/09/16   Danelle Berryapia, Leisa, PA-C  ibuprofen (ADVIL,MOTRIN) 800 MG tablet Take 1 tablet (800 mg total) by mouth 3 (three) times daily. 06/09/16   Danelle Berryapia, Leisa, PA-C  naproxen (NAPROSYN) 500 MG tablet Take 1 tablet (500 mg total) by mouth 2 (two) times daily. 11/09/17   Renne CriglerGeiple, Regena Delucchi, PA-C    Family History Family History  Family history unknown: Yes    Social History Social History   Tobacco Use  . Smoking status: Never Smoker  . Smokeless tobacco: Never Used  Substance Use Topics  . Alcohol use: Yes    Comment: 10 beers a day; quit 06/25/17  . Drug use: No     Allergies   Patient  has no known allergies.   Review of Systems Review of Systems  Constitutional: Negative for activity change.  Musculoskeletal: Positive for arthralgias. Negative for back pain, gait problem, joint swelling and neck pain.  Skin: Negative for wound.  Neurological: Negative for weakness and numbness.     Physical Exam Updated Vital Signs BP 114/71 (BP Location: Right Arm)   Pulse 79   Temp 98.9 F (37.2 C) (Oral)   Resp 16   SpO2 100%   Physical Exam  Constitutional: He appears well-developed and well-nourished.  HENT:  Head: Normocephalic and atraumatic.  Eyes: Conjunctivae are normal.  Neck: Normal range of motion. Neck supple.  Cardiovascular: Normal pulses. Exam reveals no decreased pulses.  Musculoskeletal: He exhibits tenderness. He exhibits no edema.       Left shoulder: Normal.       Left elbow: Normal.       Left wrist: He exhibits tenderness. He exhibits normal range of motion and no bony tenderness.       Arms:      Left hand: Normal.  Neurological: He is alert. No sensory deficit.  Motor, sensation, and vascular distal to the injury is fully intact.   Skin: Skin is warm and dry.  Psychiatric: He has a normal mood and affect.  Nursing note and vitals reviewed.  ED Treatments / Results  Labs (all labs ordered are listed, but only abnormal results are displayed) Labs Reviewed - No data to display  EKG None  Radiology Dg Hand Complete Left  Result Date: 11/09/2017 CLINICAL DATA:  37 year old male who awoke with left hand pain and swelling. No known injury. EXAM: LEFT HAND - COMPLETE 3+ VIEW COMPARISON:  None. FINDINGS: Bone mineralization is within normal limits. There is no evidence of fracture or dislocation. There is no evidence of arthropathy or other focal bone abnormality. Generalized soft tissue swelling. No soft tissue gas. No radiopaque foreign body identified. IMPRESSION: Soft tissue swelling with no other radiographic abnormality of the left  hand. Electronically Signed   By: Odessa Fleming M.D.   On: 11/09/2017 01:42    Procedures Procedures (including critical care time)  Medications Ordered in ED Medications  ibuprofen (ADVIL,MOTRIN) tablet 400 mg (400 mg Oral Given 11/09/17 0118)  ibuprofen (ADVIL,MOTRIN) tablet 400 mg (400 mg Oral Given 11/09/17 0544)     Initial Impression / Assessment and Plan / ED Course  I have reviewed the triage vital signs and the nursing notes.  Pertinent labs & imaging results that were available during my care of the patient were reviewed by me and considered in my medical decision making (see chart for details).     Patient seen and examined.  Interpreter used.  Patient updated on x-ray results.  Exam and history clinically consistent with radial styloid tenosynovitis.  Vital signs reviewed and are as follows: BP 114/71 (BP Location: Right Arm)   Pulse 79   Temp 98.9 F (37.2 C) (Oral)   Resp 16   SpO2 100%   Patient will be given Velcro splint to use for comfort, NSAID trial.  Encouraged follow-up with PCP if not improving.  Final Clinical Impressions(s) / ED Diagnoses   Final diagnoses:  De Quervain's disease (tenosynovitis)   Patient with radial head tenosynovitis of the left hand.  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  Imaging negative hand is neurovascularly intact.  ED Discharge Orders        Ordered    naproxen (NAPROSYN) 500 MG tablet  2 times daily     11/09/17 0626       Renne Crigler, PA-C 11/09/17 4098    Ward, Layla Maw, DO 11/09/17 [phone]

## 2019-05-09 ENCOUNTER — Emergency Department (HOSPITAL_COMMUNITY): Payer: Self-pay

## 2019-05-09 ENCOUNTER — Emergency Department (HOSPITAL_COMMUNITY)
Admission: EM | Admit: 2019-05-09 | Discharge: 2019-05-09 | Disposition: A | Discharge status: Home / Self Care | Payer: Self-pay | Attending: Emergency Medicine | Admitting: Emergency Medicine

## 2019-05-09 ENCOUNTER — Other Ambulatory Visit: Payer: Self-pay

## 2019-05-09 ENCOUNTER — Encounter (HOSPITAL_COMMUNITY): Payer: Self-pay | Admitting: Emergency Medicine

## 2019-05-09 DIAGNOSIS — Y929 Unspecified place or not applicable: Secondary | ICD-10-CM | POA: Insufficient documentation

## 2019-05-09 DIAGNOSIS — Y999 Unspecified external cause status: Secondary | ICD-10-CM | POA: Insufficient documentation

## 2019-05-09 DIAGNOSIS — S29011A Strain of muscle and tendon of front wall of thorax, initial encounter: Secondary | ICD-10-CM | POA: Insufficient documentation

## 2019-05-09 DIAGNOSIS — R0789 Other chest pain: Secondary | ICD-10-CM | POA: Insufficient documentation

## 2019-05-09 DIAGNOSIS — X58XXXA Exposure to other specified factors, initial encounter: Secondary | ICD-10-CM | POA: Insufficient documentation

## 2019-05-09 DIAGNOSIS — R079 Chest pain, unspecified: Secondary | ICD-10-CM

## 2019-05-09 DIAGNOSIS — Y939 Activity, unspecified: Secondary | ICD-10-CM | POA: Insufficient documentation

## 2019-05-09 LAB — BASIC METABOLIC PANEL
Anion gap: 12 (ref 5–15)
BUN: 7 mg/dL (ref 6–20)
CO2: 24 mmol/L (ref 22–32)
Calcium: 9 mg/dL (ref 8.9–10.3)
Chloride: 103 mmol/L (ref 98–111)
Creatinine, Ser: 0.66 mg/dL (ref 0.61–1.24)
GFR calc Af Amer: >60 mL/min (ref >60)
GFR calc non Af Amer: >60 mL/min (ref >60)
Glucose, Bld: 155 mg/dL — ABNORMAL HIGH (ref 70–99)
Potassium: 4 mmol/L (ref 3.5–5.1)
Sodium: 139 mmol/L (ref 135–145)

## 2019-05-09 LAB — CBC
HCT: 45.8 % (ref 39.0–52.0)
Hemoglobin: 15.2 g/dL (ref 13.0–17.0)
MCH: 30.4 pg (ref 26.0–34.0)
MCHC: 33.2 g/dL (ref 30.0–36.0)
MCV: 91.6 fL (ref 80.0–100.0)
Platelets: 222 10*3/uL (ref 150–400)
RBC: 5 MIL/uL (ref 4.22–5.81)
RDW: 12.4 % (ref 11.5–15.5)
WBC: 6.7 10*3/uL (ref 4.0–10.5)
nRBC: 0 % (ref 0.0–0.2)

## 2019-05-09 LAB — TROPONIN I (HIGH SENSITIVITY)
Troponin I (High Sensitivity): 6 ng/L (ref <18)
Troponin I (High Sensitivity): 5 ng/L (ref <18)

## 2019-05-09 MED ORDER — NAPROXEN 500 MG PO TABS: 500.0000 mg | ORAL_TABLET | Freq: Two times a day (BID) | ORAL | 0 refills | Status: DC | PRN

## 2019-05-09 MED ORDER — KETOROLAC TROMETHAMINE 60 MG/2ML IM SOLN
30.0000 mg | Freq: Once | INTRAMUSCULAR | Status: AC
  Administered 2019-05-09: 30 mg via INTRAMUSCULAR
  Filled 2019-05-09: qty 2

## 2019-05-09 MED ORDER — SODIUM CHLORIDE 0.9% FLUSH: 3.0000 mL | Freq: Once | INTRAVENOUS | Status: DC

## 2019-05-09 MED ORDER — DIAZEPAM 2 MG PO TABS
2.0000 mg | ORAL_TABLET | Freq: Once | ORAL | Status: AC
  Administered 2019-05-09: 2 mg via ORAL
  Filled 2019-05-09: qty 1

## 2019-05-09 MED ORDER — CYCLOBENZAPRINE HCL 10 MG PO TABS: 10.0000 mg | ORAL_TABLET | Freq: Two times a day (BID) | ORAL | 0 refills | Status: DC | PRN

## 2019-05-09 NOTE — Discharge Instructions (Signed)
Recommend taking new medication as prescribed for your chest pain.  Additionally recommend taking Tylenol.  Can also take the muscle relaxer as prescribed.  If you feel your symptoms worsen you develop shortness of breath or other new concerning symptom recommend return to ER for reassessment.  Otherwise recommend following up with primary doctor next week for recheck.

## 2019-05-09 NOTE — ED Triage Notes (Signed)
Pt states he started having CP yesterday and it has worsened today. Pain radiates to left shoulder. Pt went to primary MD and was sent here due to continuing cp. Denies N/V

## 2019-05-09 NOTE — ED Provider Notes (Signed)
[facility] EMERGENCY DEPARTMENT Provider Note   CSN: [id number] Arrival date & time: 05/09/19  1618     History   Chief Complaint Chief Complaint  Patient presents with  . Chest Pain    HPI [facility] Green is a 38 y.o. male.  Presents emerge department chief complaint chest pain.  Patient states pain started yesterday, worsening throughout the day today.  Up to 10/10 in severity, currently moderate.  States worsens with certain positions, worsens with deep breath, states his chest wall is very tender to palpation.  Works in a [employer].  No heavy lifting, no recent injuries.  No family history of coronary artery disease, no known medical problems, no smoking history.  Has not taken any medications for his pain.  No shortness of breath, syncope, fever.     HPI  Past Medical History:  Diagnosis Date  . Known health problems: none     Patient Active Problem List   Diagnosis Date Noted  . Palpable purpura (HCC) 07/03/2017  . High risk sexual behavior 07/03/2017    Past Surgical History:  Procedure Laterality Date  . NO PAST SURGERIES          Home Medications    Prior to Admission medications   Medication Sig Start Date End Date Taking? Authorizing Provider  cyclobenzaprine (FLEXERIL) 10 MG tablet Take 1 tablet (10 mg total) by mouth 2 (two) times daily as needed for muscle spasms. 06/09/16   Danelle Berry, PA-C  HYDROcodone-acetaminophen (NORCO/VICODIN) 5-325 MG tablet Take 1-2 tablets by mouth every 8 (eight) hours as needed. 06/09/16   Danelle Berry, PA-C  ibuprofen (ADVIL,MOTRIN) 800 MG tablet Take 1 tablet (800 mg total) by mouth 3 (three) times daily. 06/09/16   Danelle Berry, PA-C  naproxen (NAPROSYN) 500 MG tablet Take 1 tablet (500 mg total) by mouth 2 (two) times daily. 11/09/17   Renne Crigler, PA-C    Family History Family History  Family history unknown: Yes    Social History Social History   Tobacco Use  . Smoking  status: Never Smoker  . Smokeless tobacco: Never Used  Substance Use Topics  . Alcohol use: Yes    Comment: 10 beers a day; quit 06/25/17  . Drug use: No     Allergies   Patient has no known allergies.   Review of Systems Review of Systems  Constitutional: Negative for chills and fever.  HENT: Negative for ear pain and sore throat.   Eyes: Negative for pain and visual disturbance.  Respiratory: Negative for cough and shortness of breath.   Cardiovascular: Positive for chest pain. Negative for palpitations.  Gastrointestinal: Negative for abdominal pain and vomiting.  Genitourinary: Negative for dysuria and hematuria.  Musculoskeletal: Negative for arthralgias and back pain.  Skin: Negative for color change and rash.  Neurological: Negative for seizures and syncope.  All other systems reviewed and are negative.    Physical Exam Updated Vital Signs BP (!) 127/97 (BP Location: Right Arm)   Pulse 64   Temp 98.7 F (37.1 C) (Oral)   Resp (!) 25   Wt 56.7 kg   SpO2 100%   BMI 21.46 kg/m   Physical Exam Vitals signs and nursing note reviewed.  Constitutional:      Appearance: He is well-developed.  HENT:     Head: Normocephalic and atraumatic.  Eyes:     Conjunctiva/sclera: Conjunctivae normal.  Neck:     Musculoskeletal: Neck supple.  Cardiovascular:     Rate and  Rhythm: Normal rate and regular rhythm.     Heart sounds: No murmur.  Pulmonary:     Effort: Pulmonary effort is normal. No respiratory distress.     Breath sounds: Normal breath sounds.  Chest:     Comments: Focal tenderness palpation noted over left anterior, left lateral chest wall, no deformity, no erythema, no induration Abdominal:     Palpations: Abdomen is soft.     Tenderness: There is no abdominal tenderness.  Skin:    General: Skin is warm and dry.  Neurological:     Mental Status: He is alert.      ED Treatments / Results  Labs (all labs ordered are listed, but only abnormal  results are displayed) Labs Reviewed  BASIC METABOLIC PANEL - Abnormal; Notable for the following components:      Result Value   Glucose, Bld 155 (*)    All other components within normal limits  CBC  TROPONIN I (HIGH SENSITIVITY)  TROPONIN I (HIGH SENSITIVITY)    EKG EKG Interpretation  Date/Time:  Thursday May 09 2019 16:25:22 EDT Ventricular Rate:  89 PR Interval:  140 QRS Duration: 84 QT Interval:  346 QTC Calculation: 420 R Axis:   96 Text Interpretation:  Normal sinus rhythm Rightward axis No prior available for comparison No acute STEMI Confirmed by Madalyn Rob [phone]) on 05/09/2019 9:54:36 PM   Radiology Dg Chest 2 View  Result Date: 05/09/2019 CLINICAL DATA:  Onset chest pain yesterday which is worse today. Pain radiates to the left shoulder. EXAM: CHEST - 2 VIEW COMPARISON:  Single-view of the chest 06/09/2016. FINDINGS: Lungs are clear. Heart size is normal. No pneumothorax or pleural fluid. No acute or focal bony abnormality. IMPRESSION: Negative chest. Electronically Signed   By: Inge Rise M.D.   On: 05/09/2019 17:08    Procedures Procedures (including critical care time)  Medications Ordered in ED Medications  sodium chloride flush (NS) 0.9 % injection 3 mL (has no administration in time range)  ketorolac (TORADOL) injection 30 mg (30 mg Intramuscular Given 05/09/19 2242)  diazepam (VALIUM) tablet 2 mg (2 mg Oral Given 05/09/19 2241)     Initial Impression / Assessment and Plan / ED Course  I have reviewed the triage vital signs and the nursing notes.  Pertinent labs & imaging results that were available during my care of the patient were reviewed by me and considered in my medical decision making (see chart for details).        38 year old Hispanic gentleman presents to ER with new onset chest pain.  Patient noted to be well-appearing, significant tenderness palpation over chest wall.  EKG without acute ischemic changes, serial troponin  within normal limits, no delta troponin.  No family history and no known risk factors, low suspicion for acute coronary syndrome given this and above work-up.  Chest x-ray without acute abnormality.  Suspect most likely etiology MSK.  Given anti-inflammatories, recommend recheck with PCP, reviewed precautions, will discharge home.  Utilized Associate Professor throughout entirety of visit.  After the discussed management above, the patient was determined to be safe for discharge.  The patient was in agreement with this plan and all questions regarding their care were answered.  ED return precautions were discussed and the patient will return to the ED with any significant worsening of condition.    Final Clinical Impressions(s) / ED Diagnoses   Final diagnoses:  Chest pain, unspecified type  Muscle strain of chest wall, initial encounter    ED  Discharge Orders    None       Milagros Lollykstra, Nimo Verastegui S, MD 05/09/19 2322

## 2019-05-09 NOTE — ED Notes (Signed)
Spanish speaking RN discussed discharge instructions including medications and follow up care. Pt verbalized understanding and time to ask questions was given. Pt has no other questions at this time.

## 2019-05-10 ENCOUNTER — Encounter (HOSPITAL_COMMUNITY): Payer: Self-pay

## 2019-05-10 ENCOUNTER — Emergency Department (HOSPITAL_COMMUNITY): Payer: Self-pay

## 2019-05-10 ENCOUNTER — Emergency Department (HOSPITAL_COMMUNITY)
Admission: EM | Admit: 2019-05-10 | Discharge: 2019-05-10 | Disposition: A | Discharge status: Home / Self Care | Payer: Self-pay | Attending: Emergency Medicine | Admitting: Emergency Medicine

## 2019-05-10 ENCOUNTER — Other Ambulatory Visit: Payer: Self-pay

## 2019-05-10 DIAGNOSIS — R079 Chest pain, unspecified: Secondary | ICD-10-CM | POA: Insufficient documentation

## 2019-05-10 DIAGNOSIS — M25512 Pain in left shoulder: Secondary | ICD-10-CM | POA: Insufficient documentation

## 2019-05-10 LAB — COMPREHENSIVE METABOLIC PANEL
ALT: 133 U/L — ABNORMAL HIGH (ref 0–44)
AST: 122 U/L — ABNORMAL HIGH (ref 15–41)
Albumin: 3.9 g/dL (ref 3.5–5.0)
Alkaline Phosphatase: 86 U/L (ref 38–126)
Anion gap: 10 (ref 5–15)
BUN: 10 mg/dL (ref 6–20)
CO2: 27 mmol/L (ref 22–32)
Calcium: 9.1 mg/dL (ref 8.9–10.3)
Chloride: 101 mmol/L (ref 98–111)
Creatinine, Ser: 1.05 mg/dL (ref 0.61–1.24)
GFR calc Af Amer: >60 mL/min (ref >60)
GFR calc non Af Amer: >60 mL/min (ref >60)
Glucose, Bld: 141 mg/dL — ABNORMAL HIGH (ref 70–99)
Potassium: 3.9 mmol/L (ref 3.5–5.1)
Sodium: 138 mmol/L (ref 135–145)
Total Bilirubin: 0.8 mg/dL (ref 0.3–1.2)
Total Protein: 7.6 g/dL (ref 6.5–8.1)

## 2019-05-10 LAB — LIPASE, BLOOD: Lipase: 30 U/L (ref 11–51)

## 2019-05-10 LAB — URINALYSIS, ROUTINE W REFLEX MICROSCOPIC
Bilirubin Urine: NEGATIVE
Glucose, UA: NEGATIVE mg/dL
Hgb urine dipstick: NEGATIVE
Ketones, ur: NEGATIVE mg/dL
Leukocytes,Ua: NEGATIVE
Nitrite: NEGATIVE
Protein, ur: NEGATIVE mg/dL
Specific Gravity, Urine: 1.015 (ref 1.005–1.030)
pH: 6 (ref 5.0–8.0)

## 2019-05-10 LAB — CBC
HCT: 46 % (ref 39.0–52.0)
Hemoglobin: 15.7 g/dL (ref 13.0–17.0)
MCH: 31.6 pg (ref 26.0–34.0)
MCHC: 34.1 g/dL (ref 30.0–36.0)
MCV: 92.6 fL (ref 80.0–100.0)
Platelets: 206 10*3/uL (ref 150–400)
RBC: 4.97 MIL/uL (ref 4.22–5.81)
RDW: 12.3 % (ref 11.5–15.5)
WBC: 10.6 10*3/uL — ABNORMAL HIGH (ref 4.0–10.5)
nRBC: 0 % (ref 0.0–0.2)

## 2019-05-10 LAB — TROPONIN I (HIGH SENSITIVITY): Troponin I (High Sensitivity): 5 ng/L (ref <18)

## 2019-05-10 LAB — D-DIMER, QUANTITATIVE: D-Dimer, Quant: 0.31 ug/mL-FEU (ref 0.00–0.50)

## 2019-05-10 MED ORDER — HYDROMORPHONE HCL 1 MG/ML IJ SOLN
1.0000 mg | Freq: Once | INTRAMUSCULAR | Status: AC
  Administered 2019-05-10: 20:00:00 1 mg via INTRAVENOUS
  Filled 2019-05-10: qty 1

## 2019-05-10 MED ORDER — SODIUM CHLORIDE 0.9% FLUSH: 3.0000 mL | Freq: Once | INTRAVENOUS | Status: DC

## 2019-05-10 NOTE — ED Provider Notes (Signed)
[facility] DEPT [facility] Emergency Department Provider Note MRN:  [id number]  Arrival date & time: 05/10/19     Chief Complaint   Chest pain History of Present Illness   Evan Green is a 38 y.o. year-old male with no pertinent past medical history presenting to the ED with chief complaint of chest pain.  Location: Left shoulder and left chest Duration: 2 or 3 days Onset: Gradual Timing: Constant Description: Sharp Severity: Severe Exacerbating/Alleviating Factors: Worse when palpating the chest or moving the left arm Associated Symptoms: None Pertinent Negatives: Denies dizziness, no diaphoresis, no shortness of breath, no nausea, no vomiting, no cough, no abdominal pain, no fever, no numbness or weakness to the arms or legs.   Review of Systems  A complete 10 system review of systems was obtained and all systems are negative except as noted in the HPI and PMH.   Patient's Health History    Past Medical History:  Diagnosis Date  . Known health problems: none     Past Surgical History:  Procedure Laterality Date  . NO PAST SURGERIES      Family History  Family history unknown: Yes    Social History   Socioeconomic History  . Marital status: Single    Spouse name: Not on file  . Number of children: Not on file  . Years of education: Not on file  . Highest education level: Not on file  Occupational History  . Occupation: [employer]  . Financial resource strain: Not on file  . Food insecurity    Worry: Not on file    Inability: Not on file  . Transportation needs    Medical: Not on file    Non-medical: Not on file  Tobacco Use  . Smoking status: Never Smoker  . Smokeless tobacco: Never Used  Substance and Sexual Activity  . Alcohol use: Yes    Comment: 10 beers a day; quit 06/25/17  . Drug use: No  . Sexual activity: Yes    Partners: Female    Comment: 6 different partners in the past year. Uses condoms occasionally.    Lifestyle  . Physical activity    Days per week: Not on file    Minutes per session: Not on file  . Stress: Not on file  Relationships  . Social Musician on phone: Not on file    Gets together: Not on file    Attends religious service: Not on file    Active member of club or organization: Not on file    Attends meetings of clubs or organizations: Not on file    Relationship status: Not on file  . Intimate partner violence    Fear of current or ex partner: Not on file    Emotionally abused: Not on file    Physically abused: Not on file    Forced sexual activity: Not on file  Other Topics Concern  . Not on file  Social History Narrative   Lives alone. In [place].      Physical Exam  Vital Signs and Nursing Notes reviewed Vitals:   05/10/19 2100 05/10/19 2115  BP: 122/84 (!) 139/93  Pulse: 71 88  Resp: 15 (!) 21  Temp:    SpO2: 96% 98%    CONSTITUTIONAL: Well-appearing, NAD NEURO:  Alert and oriented x 3, no focal deficits EYES:  eyes equal and reactive ENT/NECK:  no LAD, no JVD CARDIO: Regular rate, well-perfused, normal S1 and S2 PULM:  CTAB no wheezing or rhonchi GI/GU:  normal bowel sounds, non-distended, non-tender MSK/SPINE:  No gross deformities, no edema SKIN:  no rash, atraumatic PSYCH:  Appropriate speech and behavior  Diagnostic and Interventional Summary    EKG Interpretation  Date/Time:  Friday May 10 2019 19:14:36 EDT Ventricular Rate:  77 PR Interval:    QRS Duration: 88 QT Interval:  355 QTC Calculation: 402 R Axis:   81 Text Interpretation:  Sinus rhythm ST elev, probable normal early repol pattern Confirmed by Gerlene Fee [phone]) on 05/10/2019 8:38:11 PM      Labs Reviewed  COMPREHENSIVE METABOLIC PANEL - Abnormal; Notable for the following components:      Result Value   Glucose, Bld 141 (*)    AST 122 (*)    ALT 133 (*)    All other components within normal limits  CBC - Abnormal; Notable for the following  components:   WBC 10.6 (*)    All other components within normal limits  LIPASE, BLOOD  URINALYSIS, ROUTINE W REFLEX MICROSCOPIC  D-DIMER, QUANTITATIVE (NOT AT [facility])  TROPONIN I (HIGH SENSITIVITY)  TROPONIN I (HIGH SENSITIVITY)    DG Shoulder Left  Final Result    DG Humerus Left  Final Result      Medications  sodium chloride flush (NS) 0.9 % injection 3 mL (has no administration in time range)  HYDROmorphone (DILAUDID) injection 1 mg (1 mg Intravenous Given 05/10/19 2007)     Procedures Critical Care  ED Course and Medical Decision Making  I have reviewed the triage vital signs and the nursing notes.  Pertinent labs & imaging results that were available during my care of the patient were reviewed by me and considered in my medical decision making (see below for details).  Patient was evaluated for this chest pain yesterday with 2- troponins and was discharged.  It is largely unchanged, the exam is highly suggestive of a musculoskeletal etiology.  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  To me this raises the concern for a vascular etiology, and also to order a d-dimer.  Patient has no rash today, he has preserved range of motion of the shoulder and there is no evidence to suggest infection or septic joint.  Imaging today is unremarkable, labs are reassuring, troponin and d-dimer are negative.  LFTs are mildly elevated, similar to prior values on record.  In the past this was thought to be related to his alcohol use and he confirms that he still drinks.  Advised follow-up with his primary care doctor and advised to cut down on his drinking.  He has absolutely no right upper quadrant abdominal tenderness and no indication for abdominal imaging today.       Barth Kirks. [place]onia Small, [place] [email or web address]  Final  Clinical Impressions(s) / ED Diagnoses     ICD-10-CM   1. Chest pain, unspecified type  R07.9   2. Acute pain of left shoulder  M25.512     ED Discharge Orders    None      Discharge Instructions Discussed with and Provided to Patient:   Discharge Instructions     You were evaluated in the Emergency Department and after careful evaluation, we did not find any emergent condition requiring admission or further testing in the hospital.  Your exam/testing today was overall reassuring.  Please continue to  take the medications you have at home and follow-up with your primary care doctor.  As discussed, try to cut down on your drinking to help improve your liver function.  Please return to the Emergency Department if you experience any worsening of your condition.  We encourage you to follow up with a primary care provider.  Thank you for allowing us to be a part of your care.        Sabas SousBero, Minka Knight M, MD 05/10/19 2137

## 2019-05-10 NOTE — ED Triage Notes (Signed)
Pt sent here by PCP for elevated Liver enzymes AST 80 ALT 77. Pt c.o mild abd pain. No vomiting. Pt seen here yesterday for chest pain. Pt a.o, nad noted.

## 2019-05-10 NOTE — ED Notes (Signed)
Patient verbalizes understanding of discharge instructions. Opportunity for questioning and answers were provided. Armband removed by staff, pt discharged from ED ambulatory to home.  

## 2019-05-10 NOTE — Discharge Instructions (Addendum)
You were evaluated in the Emergency Department and after careful evaluation, we did not find any emergent condition requiring admission or further testing in the hospital.  Your exam/testing today was overall reassuring.  Please continue to take the medications you have at home and follow-up with your primary care doctor.  As discussed, try to cut down on your drinking to help improve your liver function.  Please return to the Emergency Department if you experience any worsening of your condition.  We encourage you to follow up with a primary care provider.  Thank you for allowing [place] to be a part of your care.

## 2021-01-08 IMAGING — CR DG HUMERUS 2V *L*
2 series · 2 of 2 positions shown · non-contrast
Comparison: None.

CLINICAL DATA: Left arm pain.

EXAM:
LEFT HUMERUS - 2+ VIEW

[humerus ap]
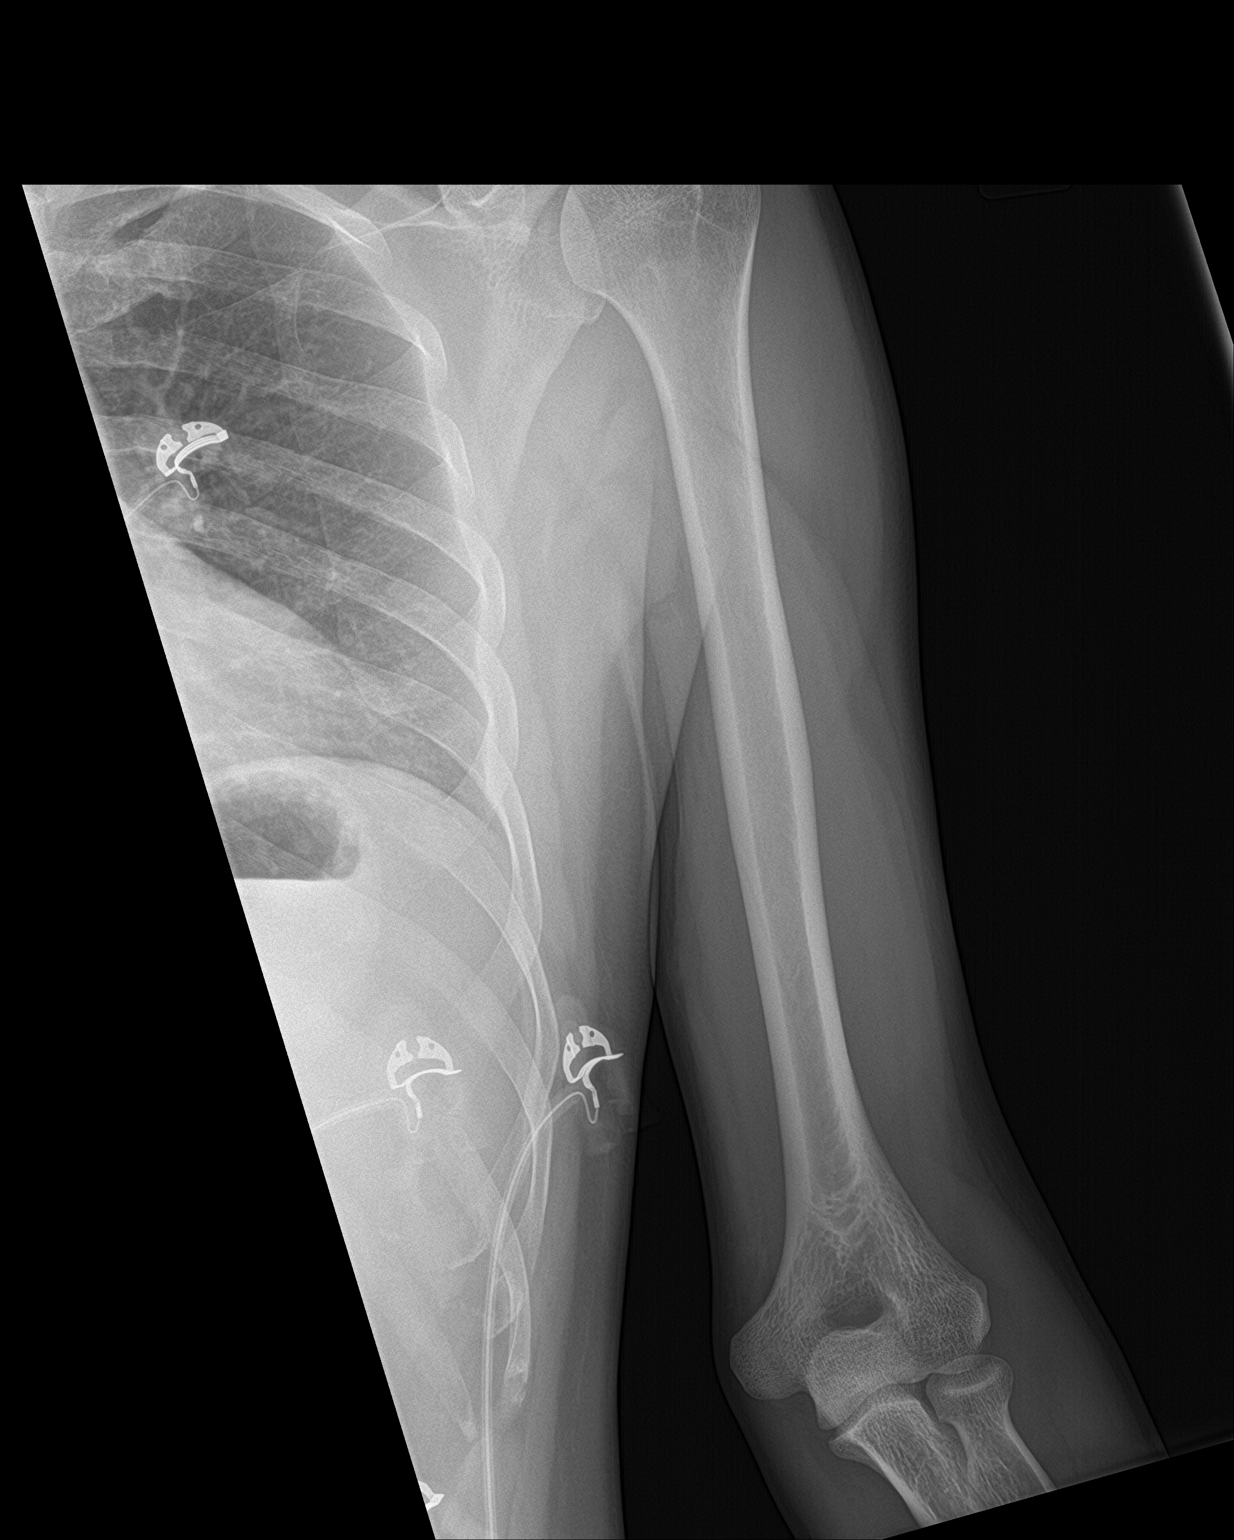

[humerus lat]
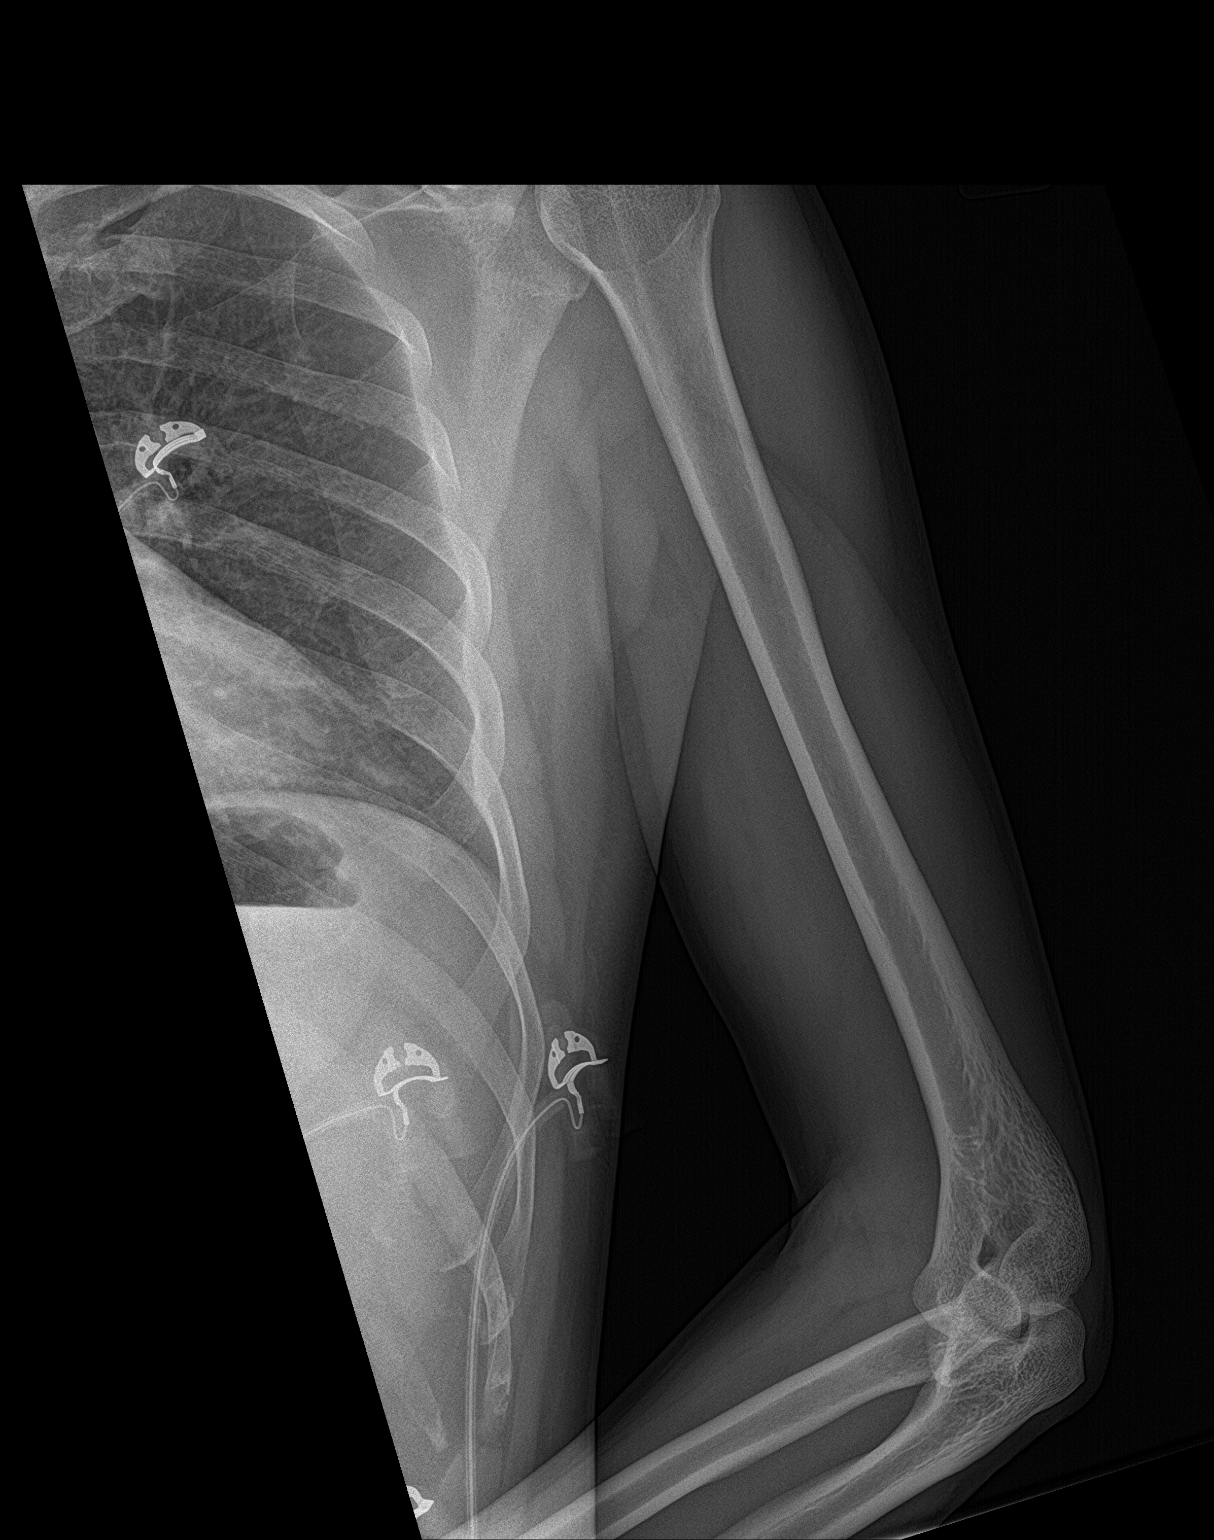

[2 of 2 positions shown; findings below may reference images not displayed]

FINDINGS: The shoulder and elbow joints are unremarkable. The humerus is
normal. The left ribs are intact and the visualized left lung is
grossly clear.
IMPRESSION: No acute bony findings.

## 2021-02-12 ENCOUNTER — Other Ambulatory Visit: Payer: Self-pay

## 2021-02-12 ENCOUNTER — Emergency Department (HOSPITAL_COMMUNITY)
Admission: EM | Admit: 2021-02-12 | Discharge: 2021-02-13 | Disposition: A | Discharge status: Home / Self Care | Payer: Self-pay | Attending: Emergency Medicine | Admitting: Emergency Medicine

## 2021-02-12 DIAGNOSIS — M10071 Idiopathic gout, right ankle and foot: Secondary | ICD-10-CM | POA: Insufficient documentation

## 2021-02-12 DIAGNOSIS — R600 Localized edema: Secondary | ICD-10-CM | POA: Insufficient documentation

## 2021-02-12 DIAGNOSIS — M109 Gout, unspecified: Secondary | ICD-10-CM

## 2021-02-12 NOTE — ED Triage Notes (Signed)
Pt states he went to clinic, was told to come here to be evaluated for DVT- endorses swelling, tenderness, edema, 10/10 tense pain Hx high uric acid/gout

## 2021-02-12 NOTE — ED Provider Notes (Signed)
Emergency Medicine Provider Triage Evaluation Note  Evan Green , a 40 y.o. male  was evaluated in triage.  Pt complains of 3 days of worsening pain and swelling to his right ankle and calf.  He has a h/o gout. Pain is 10/10. No fevers, n/v, CP, SOB. Sent from [facility] for DVT rule out.  Physical Exam  There were no vitals taken for this visit. Gen:   Awake, no distress   Resp:  Normal effort  MSK:   Moves extremities without difficulty  Other:    Medical Decision Making  Medically screening exam initiated at 8:43 PM.  Appropriate orders placed.  Ibrohim Faraone was informed that the remainder of the evaluation will be completed by another provider, this initial triage assessment does not replace that evaluation, and the importance of remaining in the ED until their evaluation is complete.   Placido Sou, PA-C 02/12/21 2044    Charlynne Pander, MD 02/12/21 2147

## 2021-02-13 ENCOUNTER — Emergency Department (HOSPITAL_BASED_OUTPATIENT_CLINIC_OR_DEPARTMENT_OTHER): Payer: Self-pay

## 2021-02-13 DIAGNOSIS — M109 Gout, unspecified: Secondary | ICD-10-CM

## 2021-02-13 DIAGNOSIS — M7989 Other specified soft tissue disorders: Secondary | ICD-10-CM

## 2021-02-13 LAB — I-STAT CHEM 8, ED
BUN: <3 mg/dL — ABNORMAL LOW (ref 6–20)
Calcium, Ion: 1.17 mmol/L (ref 1.15–1.40)
Chloride: 101 mmol/L (ref 98–111)
Creatinine, Ser: 0.6 mg/dL — ABNORMAL LOW (ref 0.61–1.24)
Glucose, Bld: 122 mg/dL — ABNORMAL HIGH (ref 70–99)
HCT: 41 % (ref 39.0–52.0)
Hemoglobin: 13.9 g/dL (ref 13.0–17.0)
Potassium: 3.5 mmol/L (ref 3.5–5.1)
Sodium: 140 mmol/L (ref 135–145)
TCO2: 27 mmol/L (ref 22–32)

## 2021-02-13 MED ORDER — COLCHICINE 0.6 MG PO TABS: 0.6000 mg | ORAL_TABLET | Freq: Every day | ORAL | 0 refills | Status: DC

## 2021-02-13 MED ORDER — DEXAMETHASONE SODIUM PHOSPHATE 10 MG/ML IJ SOLN
10.0000 mg | Freq: Once | INTRAMUSCULAR | Status: AC
  Administered 2021-02-13: 10 mg via INTRAMUSCULAR
  Filled 2021-02-13: qty 1

## 2021-02-13 MED ORDER — IBUPROFEN 800 MG PO TABS
800.0000 mg | ORAL_TABLET | Freq: Once | ORAL | Status: AC
  Administered 2021-02-13: 800 mg via ORAL
  Filled 2021-02-13: qty 1

## 2021-02-13 MED ORDER — PREDNISONE 10 MG PO TABS: 20.0000 mg | ORAL_TABLET | Freq: Every day | ORAL | 0 refills | Status: DC

## 2021-02-13 MED ORDER — IBUPROFEN 600 MG PO TABS: 600.0000 mg | ORAL_TABLET | Freq: Four times a day (QID) | ORAL | 0 refills | Status: DC | PRN

## 2021-02-13 NOTE — ED Provider Notes (Signed)
[facility] EMERGENCY DEPARTMENT Provider Note   CSN: [id number] Arrival date & time: 02/12/21  1956     History No chief complaint on file.   Shermar Friedland is a 40 y.o. male.  The history is provided by the patient. The history is limited by a language barrier. A language interpreter was used.   40 year old Hispanic male who was sent here from urgent care center for evaluation of ankle pain.  History obtained using language interpreter.  Patient report for the past 4 days he has had persistent pain primarily to his right lower extremity.  Pain is sharp throbbing achy involving his knee And Ankle.  Pain Felt Somewhat Similar to Prior Gouty Flare That He Has Had in the past.  He Did Try to Take Some Leftover Medication with Some Relief.  No Associated Fever Chest Pain Shortness of Breath No Recent Injury No Numbness No Back Pain.  Patient Was Seen at the Urgent Care Center Yesterday for His Complaint but Was Told to Come to the ER to Have a DVT Study to Rule out Blood Clot.  He Has Never Had Blood Clot in the past Month Denies Any Recent Long Travel, or Any Recent Injury.  No Recent Surgery.  No Report of Any Chest Pain or Shortness of Breath.  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 Authorizing Provider  cyclobenzaprine (FLEXERIL) 10 MG tablet Take 1 tablet (10 mg total) by mouth 2 (two)  times daily as needed for muscle spasms. 05/09/19   Milagros Loll, MD  HYDROcodone-acetaminophen (NORCO/VICODIN) 5-325 MG tablet Take 1-2 tablets by mouth every 8 (eight) hours as needed. 06/09/16   Danelle Berry, PA-C  ibuprofen (ADVIL,MOTRIN) 800 MG tablet Take 1 tablet (800 mg total) by mouth 3 (three) times daily. 06/09/16   Danelle Berry, PA-C  naproxen (NAPROSYN) 500 MG tablet Take 1 tablet (500 mg total) by mouth 2 (two) times daily as needed. 05/09/19   Milagros Loll, MD    Allergies    Patient has no known allergies.  Review of Systems   Review of Systems  All other systems reviewed and are negative.  Physical Exam Updated Vital Signs BP (!) 128/94 (BP Location: Right Arm)   Pulse 82   Temp 99.3 F (37.4 C) (Oral)   Resp (!) 24   SpO2 100%   Physical Exam Vitals and nursing note reviewed.  Constitutional:      General: He is not in acute distress.    Appearance: He is well-developed.  HENT:     Head: Atraumatic.  Eyes:     Conjunctiva/sclera: Conjunctivae normal.  Musculoskeletal:        General: Tenderness (Right lower extremity: There is streaking erythema noted to both medial lateral distal extremity with edema to the  right foot and ankle.  Mild tenderness to palpation of the right calf.  Knee and ankle with full range of motion, intact DP pulse) present.     Cervical back: Neck supple.  Skin:    Findings: No rash.  Neurological:     Mental Status: He is alert.    ED Results / Procedures / Treatments   Labs (all labs ordered are listed, but only abnormal results are displayed) Labs Reviewed  I-STAT CHEM 8, ED - Abnormal; Notable for the following components:      Result Value   BUN <3 (*)    Creatinine, Ser 0.60 (*)    Glucose, Bld 122 (*)    All other components within normal limits    EKG None  Radiology VAS US LOWER EXTREMITY VENOUS (DVT) (ONLY [facility] & [facility])  Result Date: 02/13/2021  Lower Venous DVT Study Patient Name:  Gavynn Forrest   Date of Exam:   02/13/2021 Medical Rec #: [id number]              Accession #:    [id number] Date of Birth: 1981-05-17             Patient Gender: M Patient Age:   39Y Exam [facility]  [facility] Procedure:      VAS US LOWER EXTREMITY VENOUS (DVT) Referring Phys: [id number] Placido SouLOGAN JOLDERSMA --------------------------------------------------------------------------------  Indications: Swelling of ankle. History of gout.  Comparison Study: No prior study on file Performing Technologist: Sherren Kernsandace Kanady RVS  Examination Guidelines: A complete evaluation includes B-mode imaging, spectral Doppler, color Doppler, and power Doppler as needed of all accessible portions of each vessel. Bilateral testing is considered an integral part of a complete examination. Limited examinations for reoccurring indications may be performed as noted. The reflux portion of the exam is performed with the patient in reverse Trendelenburg.  +---------+---------------+---------+-----------+----------+--------------+ RIGHT    CompressibilityPhasicitySpontaneityPropertiesThrombus Aging +---------+---------------+---------+-----------+----------+--------------+ CFV      Full           Yes      Yes                                 +---------+---------------+---------+-----------+----------+--------------+ SFJ      Full                                                        +---------+---------------+---------+-----------+----------+--------------+ FV Prox  Full                                                        +---------+---------------+---------+-----------+----------+--------------+ FV Mid   Full                                                        +---------+---------------+---------+-----------+----------+--------------+ FV DistalFull                                                        +---------+---------------+---------+-----------+----------+--------------+  PFV      Full                                                         +---------+---------------+---------+-----------+----------+--------------+ POP      Full           Yes      Yes                                 +---------+---------------+---------+-----------+----------+--------------+ PTV      Full                                                        +---------+---------------+---------+-----------+----------+--------------+ PERO     Full                                                        +---------+---------------+---------+-----------+----------+--------------+   +----+---------------+---------+-----------+----------+--------------+ LEFTCompressibilityPhasicitySpontaneityPropertiesThrombus Aging +----+---------------+---------+-----------+----------+--------------+ CFV Full           Yes      Yes                                 +----+---------------+---------+-----------+----------+--------------+     *See table(s) above for measurements and observations.    Preliminary     Procedures Procedures   Medications Ordered in ED Medications - No data to display  ED Course  I have reviewed the triage vital signs and the nursing notes.  Pertinent labs & imaging results that were available during my care of the patient were reviewed by me and considered in my medical decision making (see chart for details).    MDM Rules/Calculators/A&P                          BP 117/79   Pulse 63   Temp 98.6 F (37 C) (Oral)   Resp 13   SpO2 100%   Final Clinical Impression(s) / ED Diagnoses Final diagnoses:  Acute gout of right ankle, unspecified cause    Rx / DC Orders ED Discharge Orders          Ordered    predniSONE (DELTASONE) 10 MG tablet  Daily        02/13/21 1246    colchicine 0.6 MG tablet  Daily        02/13/21 1246    ibuprofen (ADVIL) 600 MG tablet  Every 6 hours PRN        02/13/21 1246           7:02 AM Patient here with pain and swelling and redness to his  right lower extremity for the past 4 days.  History of gout in the past.  Suspect this is likely a gouty flare however given the extensive edema and redness, will obtain venous Doppler study to rule out DVT.  No chest pain shortness of breath  no other concerning feature.  Ibuprofen given for pain.  Labs are reassuring, vascular ultrasound of the right lower extremity without any evidence of DVT.  Since patient report pain felt similar to prior gout flare, will treat with steroid, colchicine and anti-inflammatory medication.  Return precaution given. Doubt cellulitis.    Fayrene Helper, PA-C 02/14/21 1558    Vanetta Mulders, MD 02/18/21 [phone]

## 2021-02-13 NOTE — Progress Notes (Signed)
VASCULAR LAB    Right lower extremity venous duplex has been performed.  See CV proc for preliminary results.  Messaged results to Fayrene Helper, PA-C via secure chat.  Addison Freimuth, RVT 02/13/2021, 11:33 AM

## 2021-02-13 NOTE — ED Notes (Signed)
Vas tech called to verify status of [place] ordered. Per tech, will come perform study now.

## 2021-02-13 NOTE — Discharge Instructions (Addendum)
You do not have any evidence of blood clot in your leg.  Your pain is likely due to gout.  Follow instruction below.  Take medications prescribed.  Return if you have any concerns.

## 2021-07-27 ENCOUNTER — Other Ambulatory Visit: Payer: Self-pay

## 2021-07-27 ENCOUNTER — Encounter (HOSPITAL_COMMUNITY): Payer: Self-pay

## 2021-07-27 ENCOUNTER — Emergency Department (HOSPITAL_COMMUNITY)
Admission: EM | Admit: 2021-07-27 | Discharge: 2021-07-28 | Disposition: A | Discharge status: Home / Self Care | Payer: Self-pay | Attending: Emergency Medicine | Admitting: Emergency Medicine

## 2021-07-27 DIAGNOSIS — M10032 Idiopathic gout, left wrist: Secondary | ICD-10-CM

## 2021-07-27 DIAGNOSIS — M7021 Olecranon bursitis, right elbow: Secondary | ICD-10-CM

## 2021-07-27 HISTORY — DX: Gout, unspecified: M10.9

## 2021-07-27 NOTE — ED Provider Notes (Signed)
Emergency Medicine Provider Triage Evaluation Note  Evan Green , a 40 y.o. male  was evaluated in triage.  Pt complains of pain in the left hand that started on Saturday night. Patient denies injury, cuts. Reports something like this has happened before. Denies IVDU. Hx of gout. Seen previously and treated with prednisone, colchicine. Has no medication left at home. Some swelling without pain of the right elbow as well.  Review of Systems  Positive: As above Negative: As above  Physical Exam  BP 126/85    Pulse 85    Temp 98.1 F (36.7 C)    Resp 18    Ht 5\' 4"  (1.626 m)    Wt 66.2 kg    SpO2 99%    BMI 25.06 kg/m  Gen:   Awake, no distress   Resp:  Normal effort  MSK:   Moves extremities without difficulty  Other:  Left hand red, swollen, tenderness. Intact radial, ulnar pulses. No evidence of purulence or tracking redness. Some swelling of right elbow bursa with no reduction of ROM.  Medical Decision Making  Medically screening exam initiated at 8:52 PM.  Appropriate orders placed.  Magnum Clinger was informed that the remainder of the evaluation will be completed by another provider, this initial triage assessment does not replace that evaluation, and the importance of remaining in the ED until their evaluation is complete.  Likely gout flare   Francina Ames 07/27/21 2059    2060, MD 07/27/21 2322

## 2021-07-27 NOTE — ED Triage Notes (Signed)
Pt. Arrives POV C/O L. Hand pain and swelling. He also states that he has had swelling to the R. Elbow. Pt. Is unable to move the fingers on the L. Hand. Pulse is present.

## 2021-07-28 ENCOUNTER — Encounter (HOSPITAL_COMMUNITY): Payer: Self-pay | Admitting: Emergency Medicine

## 2021-07-28 MED ORDER — KETOROLAC TROMETHAMINE 30 MG/ML IJ SOLN
30.0000 mg | Freq: Once | INTRAMUSCULAR | Status: AC
  Administered 2021-07-28: 03:00:00 30 mg via INTRAMUSCULAR
  Filled 2021-07-28: qty 1

## 2021-07-28 MED ORDER — IBUPROFEN 600 MG PO TABS: 600.0000 mg | ORAL_TABLET | Freq: Four times a day (QID) | ORAL | 0 refills | Status: AC | PRN

## 2021-07-28 MED ORDER — PREDNISONE 10 MG PO TABS: 20.0000 mg | ORAL_TABLET | Freq: Every day | ORAL | 0 refills | Status: AC

## 2021-07-28 MED ORDER — COLCHICINE 0.6 MG PO TABS: ORAL_TABLET | ORAL | 0 refills | Status: AC

## 2021-07-28 MED ORDER — PREDNISONE 20 MG PO TABS
60.0000 mg | ORAL_TABLET | Freq: Once | ORAL | Status: AC
  Administered 2021-07-28: 03:00:00 60 mg via ORAL
  Filled 2021-07-28: qty 3

## 2021-07-28 NOTE — ED Provider Notes (Signed)
[facility]-EMERGENCY DEPT Provider Note: Lowella Dell, MD, FACEP  CSN: [id number] MRN: [id number] ARRIVAL: 07/27/21 at 1918 ROOM: WA22/WA22   CHIEF COMPLAINT  Hand Pain   HISTORY OF PRESENT ILLNESS  07/28/21 1:52 AM Evan Green is a 40 y.o. male with a history of gout.  He is here with pain in his left wrist that began 4 days ago.  The pain is now radiating to his entire left hand with some associated swelling and stiffness.  Range of motion is decreased due to pain and swelling.  He rates the pain as a 10 out of 10, worse with palpation or movement.  He describes the pain is like that of previous gout.  He states his previous gout treatment was very effective.  He has not had a fever with this.  He is also having some swelling of the right elbow but with minimal pain.   Past Medical History:  Diagnosis Date   Gout     Past Surgical History:  Procedure Laterality Date   NO PAST SURGERIES      Family History  Family history unknown: Yes    Social History   Tobacco Use   Smoking status: Never   Smokeless tobacco: Never  Substance Use Topics   Alcohol use: Yes    Comment: 10 beers a day; quit 06/25/17   Drug use: No    Prior to Admission medications   Medication Sig Start Date End Date Taking? Authorizing Provider  colchicine 0.6 MG tablet Take 1 tablet daily until gout flare resolves. 07/28/21   Jasiah Buntin, MD  ibuprofen (ADVIL) 600 MG tablet Take 1 tablet (600 mg total) by mouth every 6 (six) hours as needed (for pain). 07/28/21   Decker Cogdell, MD  predniSONE (DELTASONE) 10 MG tablet Take 2 tablets (20 mg total) by mouth daily. 07/28/21   Elaynah Virginia, Jonny Ruiz, MD    Allergies Patient has no known allergies.   REVIEW OF SYSTEMS  Negative except as noted here or in the History of Present Illness.   PHYSICAL EXAMINATION  Initial Vital Signs Blood pressure 126/85, pulse 85, temperature 98.1 F (36.7 C), resp. rate 18, height 5\' 4"  (1.626 m), weight 66.2 kg,  SpO2 99 %.  Examination General: Well-developed, well-nourished male in no acute distress; appearance consistent with age of record HENT: normocephalic; atraumatic Eyes: Normal appearance Neck: supple Heart: regular rate and rhythm Lungs: clear to auscultation bilaterally Abdomen: soft; nondistended; nontender; bowel sounds present Extremities: Mild swelling and significant tenderness of left wrist and hand with decreased range of motion, minimal erythema with no warmth; minimally tender swelling of right olecranon bursa Neurologic: Awake, alert and oriented; motor function intact in all extremities and symmetric; no facial droop Skin: Warm and dry Psychiatric: Normal mood and affect   RESULTS  Summary of this visit's results, reviewed and interpreted by myself:   EKG Interpretation  Date/Time:    Ventricular Rate:    PR Interval:    QRS Duration:   QT Interval:    QTC Calculation:   R Axis:     Text Interpretation:         Laboratory Studies: No results found for this or any previous visit (from the past 24 hour(s)). Imaging Studies: No results found.  ED COURSE and MDM  Nursing notes, initial and subsequent vitals signs, including pulse oximetry, reviewed and interpreted by myself.  Vitals:   07/27/21 2016 07/27/21 2032  BP: 126/85   Pulse: 85   Resp: 18  Temp: 98.1 F (36.7 C)   SpO2: 99%   Weight:  66.2 kg  Height:  5\' 4"  (1.626 m)   Medications  ketorolac (TORADOL) 30 MG/ML injection 30 mg (has no administration in time range)  predniSONE (DELTASONE) tablet 60 mg (has no administration in time range)    Patient has a history of gout and his current presentation is consistent with gout of the left wrist and hand.  We will treat again with colchicine and steroids.  PROCEDURES  Procedures   ED DIAGNOSES     ICD-10-CM   1. Acute idiopathic gout of left wrist  M10.032     2. Olecranon bursitis of right elbow  M70.21          Rashaun Wichert, ,  MD 07/28/21 [phone]
